# Patient Record
Sex: Female | Born: 1960 | Race: Black or African American | Hispanic: No | Marital: Single | Smoking: Former smoker
Health system: Southern US, Community
[De-identification: ages and names within clinical notes are randomized; demographics above are authoritative.]

## PROBLEM LIST (undated history)

## (undated) DIAGNOSIS — I10 Essential (primary) hypertension: Secondary | ICD-10-CM

## (undated) DIAGNOSIS — D649 Anemia, unspecified: Secondary | ICD-10-CM

## (undated) DIAGNOSIS — I509 Heart failure, unspecified: Secondary | ICD-10-CM

## (undated) HISTORY — DX: Heart failure, unspecified: I50.9

## (undated) HISTORY — PX: SKIN GRAFT: SHX250

---

## 2014-07-19 ENCOUNTER — Encounter: Payer: Self-pay | Admitting: Emergency Medicine

## 2014-07-19 ENCOUNTER — Emergency Department: Payer: Medicaid Other

## 2014-07-19 ENCOUNTER — Inpatient Hospital Stay
Admission: EM | Admit: 2014-07-19 | Discharge: 2014-07-21 | DRG: 871 | Disposition: A | Discharge status: Home / Self Care | Payer: Medicaid Other | Attending: Internal Medicine | Admitting: Internal Medicine

## 2014-07-19 DIAGNOSIS — B9620 Unspecified Escherichia coli [E. coli] as the cause of diseases classified elsewhere: Secondary | ICD-10-CM | POA: Diagnosis present

## 2014-07-19 DIAGNOSIS — Z7982 Long term (current) use of aspirin: Secondary | ICD-10-CM | POA: Diagnosis not present

## 2014-07-19 DIAGNOSIS — R6520 Severe sepsis without septic shock: Secondary | ICD-10-CM | POA: Diagnosis present

## 2014-07-19 DIAGNOSIS — R197 Diarrhea, unspecified: Secondary | ICD-10-CM | POA: Diagnosis not present

## 2014-07-19 DIAGNOSIS — E876 Hypokalemia: Secondary | ICD-10-CM | POA: Diagnosis present

## 2014-07-19 DIAGNOSIS — N390 Urinary tract infection, site not specified: Secondary | ICD-10-CM | POA: Diagnosis present

## 2014-07-19 DIAGNOSIS — Z87891 Personal history of nicotine dependence: Secondary | ICD-10-CM | POA: Diagnosis not present

## 2014-07-19 DIAGNOSIS — D649 Anemia, unspecified: Secondary | ICD-10-CM | POA: Diagnosis present

## 2014-07-19 DIAGNOSIS — Z8249 Family history of ischemic heart disease and other diseases of the circulatory system: Secondary | ICD-10-CM

## 2014-07-19 DIAGNOSIS — Z833 Family history of diabetes mellitus: Secondary | ICD-10-CM

## 2014-07-19 DIAGNOSIS — A419 Sepsis, unspecified organism: Secondary | ICD-10-CM | POA: Diagnosis present

## 2014-07-19 DIAGNOSIS — E860 Dehydration: Secondary | ICD-10-CM | POA: Diagnosis present

## 2014-07-19 DIAGNOSIS — N179 Acute kidney failure, unspecified: Secondary | ICD-10-CM | POA: Diagnosis present

## 2014-07-19 DIAGNOSIS — N170 Acute kidney failure with tubular necrosis: Secondary | ICD-10-CM | POA: Diagnosis present

## 2014-07-19 DIAGNOSIS — R0789 Other chest pain: Secondary | ICD-10-CM | POA: Diagnosis present

## 2014-07-19 DIAGNOSIS — N3000 Acute cystitis without hematuria: Secondary | ICD-10-CM | POA: Diagnosis present

## 2014-07-19 HISTORY — DX: Anemia, unspecified: D64.9

## 2014-07-19 LAB — BASIC METABOLIC PANEL
Anion gap: 13 (ref 5–15)
BUN: 30 mg/dL — ABNORMAL HIGH (ref 6–20)
CHLORIDE: 95 mmol/L — AB (ref 101–111)
CO2: 26 mmol/L (ref 22–32)
CREATININE: 1.32 mg/dL — AB (ref 0.44–1.00)
Calcium: 9.3 mg/dL (ref 8.9–10.3)
GFR calc Af Amer: 53 mL/min — ABNORMAL LOW (ref >60)
GFR calc non Af Amer: 46 mL/min — ABNORMAL LOW (ref >60)
Glucose, Bld: 131 mg/dL — ABNORMAL HIGH (ref 65–99)
POTASSIUM: 2.9 mmol/L — AB (ref 3.5–5.1)
Sodium: 134 mmol/L — ABNORMAL LOW (ref 135–145)

## 2014-07-19 LAB — URINALYSIS COMPLETE WITH MICROSCOPIC (ARMC ONLY)
Bilirubin Urine: NEGATIVE
GLUCOSE, UA: NEGATIVE mg/dL
Ketones, ur: NEGATIVE mg/dL
NITRITE: POSITIVE — AB
Protein, ur: 100 mg/dL — AB
Specific Gravity, Urine: 1.01 (ref 1.005–1.030)
pH: 5 (ref 5.0–8.0)

## 2014-07-19 LAB — CBC
HCT: 32.5 % — ABNORMAL LOW (ref 35.0–47.0)
HEMOGLOBIN: 10.2 g/dL — AB (ref 12.0–16.0)
MCH: 22.8 pg — AB (ref 26.0–34.0)
MCHC: 31.3 g/dL — ABNORMAL LOW (ref 32.0–36.0)
MCV: 72.7 fL — ABNORMAL LOW (ref 80.0–100.0)
PLATELETS: 163 10*3/uL (ref 150–440)
RBC: 4.47 MIL/uL (ref 3.80–5.20)
RDW: 17.8 % — ABNORMAL HIGH (ref 11.5–14.5)
WBC: 14 10*3/uL — AB (ref 3.6–11.0)

## 2014-07-19 LAB — TSH: TSH: 1.659 u[IU]/mL (ref 0.350–4.500)

## 2014-07-19 LAB — TROPONIN I: Troponin I: 0.05 ng/mL — ABNORMAL HIGH (ref <0.031)

## 2014-07-19 MED ORDER — CEFTRIAXONE SODIUM 1 G IJ SOLR
1.0000 g | Freq: Once | INTRAMUSCULAR | Status: AC
  Administered 2014-07-19: 1 g via INTRAVENOUS
  Filled 2014-07-19: qty 10

## 2014-07-19 MED ORDER — CEPHALEXIN 500 MG PO CAPS: 500.0000 mg | ORAL_CAPSULE | Freq: Three times a day (TID) | ORAL | Status: DC

## 2014-07-19 MED ORDER — CEFTRIAXONE SODIUM 1 G IJ SOLR
1.0000 g | INTRAMUSCULAR | Status: DC
Start: 2014-07-20 — End: 2014-07-19

## 2014-07-19 MED ORDER — IBUPROFEN 600 MG PO TABS
ORAL_TABLET | ORAL | Status: AC
  Administered 2014-07-19: 600 mg via ORAL
  Filled 2014-07-19: qty 1

## 2014-07-19 MED ORDER — SODIUM CHLORIDE 0.9 % IV SOLN
INTRAVENOUS | Status: DC
  Administered 2014-07-20 (×2): via INTRAVENOUS

## 2014-07-19 MED ORDER — POTASSIUM CHLORIDE IN NACL 20-0.45 MEQ/L-% IV SOLN
INTRAVENOUS | Status: DC
  Administered 2014-07-19: 19:00:00 via INTRAVENOUS
  Filled 2014-07-19 (×3): qty 1000

## 2014-07-19 MED ORDER — IBUPROFEN 600 MG PO TABS
600.0000 mg | ORAL_TABLET | Freq: Once | ORAL | Status: AC
  Administered 2014-07-19: 600 mg via ORAL

## 2014-07-19 MED ORDER — HYDROCODONE-ACETAMINOPHEN 5-325 MG PO TABS
1.0000 | ORAL_TABLET | ORAL | Status: DC | PRN
  Administered 2014-07-20 – 2014-07-21 (×4): 1 via ORAL
  Filled 2014-07-19 (×4): qty 1

## 2014-07-19 MED ORDER — POTASSIUM CHLORIDE CRYS ER 20 MEQ PO TBCR
40.0000 meq | EXTENDED_RELEASE_TABLET | Freq: Once | ORAL | Status: AC
  Administered 2014-07-20: 40 meq via ORAL
  Filled 2014-07-19: qty 2

## 2014-07-19 MED ORDER — ACETAMINOPHEN 325 MG PO TABS
650.0000 mg | ORAL_TABLET | Freq: Once | ORAL | Status: AC
  Administered 2014-07-19: 650 mg via ORAL

## 2014-07-19 MED ORDER — ACETAMINOPHEN 325 MG PO TABS
650.0000 mg | ORAL_TABLET | Freq: Four times a day (QID) | ORAL | Status: DC | PRN
  Administered 2014-07-20: 650 mg via ORAL
  Filled 2014-07-19: qty 2

## 2014-07-19 MED ORDER — DEXTROSE 5 % IV SOLN
INTRAVENOUS | Status: AC
  Filled 2014-07-19: qty 10

## 2014-07-19 MED ORDER — SODIUM CHLORIDE 0.9 % IV SOLN
Freq: Once | INTRAVENOUS | Status: AC
  Administered 2014-07-19: 1000 mL via INTRAVENOUS

## 2014-07-19 MED ORDER — SODIUM CHLORIDE 0.9 % IV BOLUS (SEPSIS)
1000.0000 mL | Freq: Once | INTRAVENOUS | Status: AC
  Administered 2014-07-19: 1000 mL via INTRAVENOUS

## 2014-07-19 MED ORDER — SODIUM CHLORIDE 0.9 % IJ SOLN
3.0000 mL | Freq: Two times a day (BID) | INTRAMUSCULAR | Status: DC
  Administered 2014-07-20: 3 mL via INTRAVENOUS

## 2014-07-19 MED ORDER — ONDANSETRON HCL 4 MG/2ML IJ SOLN: 4.0000 mg | Freq: Four times a day (QID) | INTRAMUSCULAR | Status: DC | PRN

## 2014-07-19 MED ORDER — DEXTROSE 5 % IV SOLN
1.0000 g | INTRAVENOUS | Status: DC
  Administered 2014-07-20: 1 g via INTRAVENOUS
  Filled 2014-07-19 (×2): qty 10

## 2014-07-19 MED ORDER — SENNOSIDES-DOCUSATE SODIUM 8.6-50 MG PO TABS: 1.0000 | ORAL_TABLET | Freq: Every evening | ORAL | Status: DC | PRN

## 2014-07-19 MED ORDER — ENOXAPARIN SODIUM 40 MG/0.4ML ~~LOC~~ SOLN
40.0000 mg | SUBCUTANEOUS | Status: DC
  Administered 2014-07-20 (×2): 40 mg via SUBCUTANEOUS
  Filled 2014-07-19: qty 0.4

## 2014-07-19 MED ORDER — ACETAMINOPHEN 325 MG PO TABS
ORAL_TABLET | ORAL | Status: AC
  Administered 2014-07-19: 650 mg via ORAL
  Filled 2014-07-19: qty 2

## 2014-07-19 MED ORDER — ONDANSETRON HCL 4 MG PO TABS
4.0000 mg | ORAL_TABLET | Freq: Four times a day (QID) | ORAL | Status: DC | PRN
  Administered 2014-07-20: 4 mg via ORAL
  Filled 2014-07-19: qty 1

## 2014-07-19 MED ORDER — ACETAMINOPHEN 650 MG RE SUPP: 650.0000 mg | Freq: Four times a day (QID) | RECTAL | Status: DC | PRN

## 2014-07-19 NOTE — ED Notes (Signed)
Patient requesting something for pain, MD made aware, new orders received, see MAR.
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
## 2014-07-19 NOTE — ED Notes (Signed)
Pharmacy called for IV fluids to be sent to ER.

## 2014-07-19 NOTE — ED Notes (Signed)
Pharmacy called for IV fluids

## 2014-07-19 NOTE — ED Notes (Signed)
MD at bedside. 

## 2014-07-19 NOTE — ED Notes (Signed)
Pt c/o body aches,fever, chills x 2 days. Nonproductive sputum. Denies CP. RR even and unlabored. EDP at bedside. Color WNL

## 2014-07-19 NOTE — ED Notes (Signed)
Spoke with hospitalist, Dr. Sheryle Hailiamond, reports Rocephin will be rescheduled for tomorrow.

## 2014-07-19 NOTE — ED Notes (Signed)
X-ray at bedside

## 2014-07-19 NOTE — ED Notes (Signed)
Spoke with Dr. Sheryle Hailiamond, new order received, 1L NS to be administered at this time.

## 2014-07-19 NOTE — ED Notes (Signed)
C/o weakness, SOB and aching all over x 2 days, denies chest pain

## 2014-07-19 NOTE — ED Provider Notes (Signed)
[facility] Emergency Department Provider Note  ____________________________________________  Time seen: 2:30 PM  I have reviewed the triage vital signs and the nursing notes.   HISTORY  Chief Complaint Shortness of Breath and Weakness    HPI Brenda Hodge is a 54 y.o. female who complains of body aches, fever, chills, and nonproductive cough for the last 2 days. She also reports feeling very weak and tired all over. No weight changes, but she feels like her energy level is low and she is having difficulty sleeping. She feels restless. Denies any history of thyroid dysfunction, and no family history. Denies shortness of breath or chest pain. No abdominal pain, nausea vomiting or diarrhea. No lightheadedness.     Past Medical History  Diagnosis Date  . Anemia     There are no active problems to display for this patient.   History reviewed. No pertinent past surgical history.  Current Outpatient Rx  Name  Route  Sig  Dispense  Refill  . Aspirin-Caffeine 1000-65 MG PACK   Oral   Take 1 tablet by mouth daily as needed (As needed for headache).           Allergies Review of patient's allergies indicates no known allergies.  History reviewed. No pertinent family history.  Social History History  Substance Use Topics  . Smoking status: Former Games developer  . Smokeless tobacco: Not on file  . Alcohol Use: No    Review of Systems  Constitutional: Fever and chills. No weight changes Eyes:No blurry vision or double vision.  ENT: No sore throat. Cardiovascular: No chest pain. Respiratory: Nonproductive cough, no dyspnea Gastrointestinal: Negative for abdominal pain, vomiting and diarrhea.  No BRBPR or melena. Genitourinary: Negative for dysuria, urinary retention, bloody urine, or difficulty urinating. Musculoskeletal: Negative for back pain. No joint swelling or pain. Skin: Negative for rash. Neurological: Negative for headaches, focal weakness  or numbness. Psychiatric:No anxiety or depression.   Endocrine:No hot/cold intolerance, low energy, difficulty sleeping.  10-point ROS otherwise negative.  ____________________________________________   PHYSICAL EXAM:  VITAL SIGNS: ED Triage Vitals  Enc Vitals Group     BP 07/19/14 1344 132/62 mmHg     Pulse Rate 07/19/14 1342 130     Resp 07/19/14 1342 20     Temp 07/19/14 1342 98.2 F (36.8 C)     Temp Source 07/19/14 1342 Oral     SpO2 07/19/14 1342 98 %     Weight 07/19/14 1342 260 lb (117.935 kg)     Height 07/19/14 1342  (1.753 m)     Head Cir --      Peak Flow --      Pain Score 07/19/14 1344 10     Pain Loc --      Pain Edu? --      Excl. in GC? --      Constitutional: Alert and oriented. Well appearing and in no distress. Eyes: No scleral icterus. No conjunctival pallor. PERRL. EOMI. No proptosis ENT   Head: Normocephalic and atraumatic.   Nose: No congestion/rhinnorhea. No septal hematoma   Mouth/Throat: MMM, no pharyngeal erythema   Neck: No stridor. No SubQ emphysema. Mild fullness of the thyroid Hematological/Lymphatic/Immunilogical: No cervical lymphadenopathy. Cardiovascular: Regular rhythm, tachycardia to 120.Marland Kitchen Normal and symmetric distal pulses are present in all extremities. No murmurs, rubs, or gallops. Respiratory: Normal respiratory effort without tachypnea nor retractions. Breath sounds are clear and equal bilaterally. No wheezes/rales/rhonchi. Gastrointestinal: Soft and nontender. No distention. There is no  CVA tenderness.  No rebound, rigidity, or guarding. Genitourinary: deferred Musculoskeletal: Nontender with normal range of motion in all extremities. No joint effusions.  No lower extremity tenderness.  No edema. Neurologic:   Normal speech and language.  CN 2-10 normal. Motor grossly intact. No pronator drift.  Normal gait. No gross focal neurologic deficits are appreciated.  Skin:  Skin is warm, dry and intact. No rash  noted.  No petechiae, purpura, or bullae. Psychiatric: Mood and affect are normal. Speech and behavior are normal. Patient exhibits appropriate insight and judgment.  ____________________________________________    LABS (pertinent positives/negatives)  Results for orders placed or performed during the hospital encounter of 07/19/14  CBC  Result Value Ref Range   WBC 14.0 (H) 3.6 - 11.0 K/uL   RBC 4.47 3.80 - 5.20 MIL/uL   Hemoglobin 10.2 (L) 12.0 - 16.0 g/dL   HCT 16.132.5 (L) 09.635.0 - 04.547.0 %   MCV 72.7 (L) 80.0 - 100.0 fL   MCH 22.8 (L) 26.0 - 34.0 pg   MCHC 31.3 (L) 32.0 - 36.0 g/dL   RDW 40.917.8 (H) 81.111.5 - 91.414.5 %   Platelets 163 150 - 440 K/uL  Urinalysis complete, with microscopic [facility])  Result Value Ref Range   Color, Urine YELLOW (A) YELLOW   APPearance CLOUDY (A) CLEAR   Glucose, UA NEGATIVE NEGATIVE mg/dL   Bilirubin Urine NEGATIVE NEGATIVE   Ketones, ur NEGATIVE NEGATIVE mg/dL   Specific Gravity, Urine 1.010 1.005 - 1.030   Hgb urine dipstick 3+ (A) NEGATIVE   pH 5.0 5.0 - 8.0   Protein, ur 100 (A) NEGATIVE mg/dL   Nitrite POSITIVE (A) NEGATIVE   Leukocytes, UA 3+ (A) NEGATIVE   RBC / HPF TOO NUMEROUS TO COUNT 0 - 5 RBC/hpf   WBC, UA TOO NUMEROUS TO COUNT 0 - 5 WBC/hpf   Bacteria, UA MANY (A) NONE SEEN   Squamous Epithelial / LPF 0-5 (A) NONE SEEN   WBC Clumps PRESENT    Hyaline Casts, UA PRESENT      ____________________________________________   EKG  Sinus tachycardia with a rate of 121. Normal axis and intervals. Normal QRS, ST segments, and T waves.  ____________________________________________    RADIOLOGY  Chest x-ray unremarkable  ____________________________________________   PROCEDURES  ____________________________________________   INITIAL IMPRESSION / ASSESSMENT AND PLAN / ED COURSE  Pertinent labs & imaging results that were available during my care of the patient were reviewed by me and considered in my medical decision making (see chart  for details).  The patient's presentation is concerning for infection, hyperthyroidism, or metabolic dysfunction. We'll check labs, urine, chest x-ray, and give her IV fluids for the tachycardia.  ----------------------------------------- 3:16 PM on 07/19/2014 -----------------------------------------  The patient has an elevated white blood cell count and her urinalysis reveals a clear urinary tract infection. CONTINUE giving her IV fluids and give her IV ceftriaxone. The patient is nontoxic, well-appearing despite the fatigue and tachycardia. She does not appear to be septic or in shock. I'll follow-up the remainder of her labs, and anticipate discharging her home on oral antibiotics.  ----------------------------------------- 3:29 PM on 07/19/2014 -----------------------------------------  The patient is currently pending a chemistry and TSH.  The patient signed out to Dr. Derrill KayGoodman at 3:30 PM  ____________________________________________   FINAL CLINICAL IMPRESSION(S) / ED DIAGNOSES  Final diagnoses:  None      Sharman CheekPhillip Syna Gad, MD 07/19/14 1529

## 2014-07-19 NOTE — ED Notes (Signed)
EDP aware of critical lab values.

## 2014-07-19 NOTE — ED Notes (Addendum)
Patient tachycardic on monitor in 120-125s. Patient reports heart beating fast. Patient laying in bed, resting. Reports headache has increased to a 4/10. MD made aware.

## 2014-07-20 ENCOUNTER — Inpatient Hospital Stay: Payer: Medicaid Other

## 2014-07-20 LAB — CBC
HEMATOCRIT: 31.1 % — AB (ref 35.0–47.0)
HEMOGLOBIN: 9.6 g/dL — AB (ref 12.0–16.0)
MCH: 22.6 pg — AB (ref 26.0–34.0)
MCHC: 31 g/dL — AB (ref 32.0–36.0)
MCV: 73 fL — ABNORMAL LOW (ref 80.0–100.0)
Platelets: 137 10*3/uL — ABNORMAL LOW (ref 150–440)
RBC: 4.26 MIL/uL (ref 3.80–5.20)
RDW: 17.8 % — ABNORMAL HIGH (ref 11.5–14.5)
WBC: 15.2 10*3/uL — ABNORMAL HIGH (ref 3.6–11.0)

## 2014-07-20 LAB — BASIC METABOLIC PANEL
ANION GAP: 12 (ref 5–15)
Anion gap: 8 (ref 5–15)
BUN: 30 mg/dL — ABNORMAL HIGH (ref 6–20)
BUN: 25 mg/dL — AB (ref 6–20)
CALCIUM: 8.5 mg/dL — AB (ref 8.9–10.3)
CALCIUM: 8.1 mg/dL — AB (ref 8.9–10.3)
CO2: 26 mmol/L (ref 22–32)
CO2: 25 mmol/L (ref 22–32)
Chloride: 99 mmol/L — ABNORMAL LOW (ref 101–111)
Chloride: 101 mmol/L (ref 101–111)
Creatinine, Ser: 1.43 mg/dL — ABNORMAL HIGH (ref 0.44–1.00)
Creatinine, Ser: 1.18 mg/dL — ABNORMAL HIGH (ref 0.44–1.00)
GFR calc Af Amer: >60 mL/min (ref >60)
GFR calc non Af Amer: 42 mL/min — ABNORMAL LOW (ref >60)
GFR, EST AFRICAN AMERICAN: 48 mL/min — AB (ref >60)
GFR, EST NON AFRICAN AMERICAN: 52 mL/min — AB (ref >60)
GLUCOSE: 124 mg/dL — AB (ref 65–99)
Glucose, Bld: 115 mg/dL — ABNORMAL HIGH (ref 65–99)
Potassium: 3.8 mmol/L (ref 3.5–5.1)
Potassium: 4 mmol/L (ref 3.5–5.1)
Sodium: 137 mmol/L (ref 135–145)
Sodium: 134 mmol/L — ABNORMAL LOW (ref 135–145)

## 2014-07-20 LAB — TROPONIN I
TROPONIN I: 0.08 ng/mL — AB (ref <0.031)
Troponin I: 0.03 ng/mL (ref <0.031)
Troponin I: <0.03 ng/mL (ref <0.031)

## 2014-07-20 LAB — PHOSPHORUS: PHOSPHORUS: 1.4 mg/dL — AB (ref 2.5–4.6)

## 2014-07-20 LAB — MAGNESIUM: Magnesium: 2 mg/dL (ref 1.7–2.4)

## 2014-07-20 MED ORDER — POTASSIUM CHLORIDE CRYS ER 20 MEQ PO TBCR
40.0000 meq | EXTENDED_RELEASE_TABLET | Freq: Two times a day (BID) | ORAL | Status: DC
  Administered 2014-07-20 – 2014-07-21 (×3): 40 meq via ORAL
  Filled 2014-07-20 (×3): qty 2

## 2014-07-20 MED ORDER — POTASSIUM CHLORIDE IN NACL 20-0.9 MEQ/L-% IV SOLN
INTRAVENOUS | Status: DC
  Administered 2014-07-20 – 2014-07-21 (×2): via INTRAVENOUS
  Filled 2014-07-20 (×5): qty 1000

## 2014-07-20 MED ORDER — CEFTRIAXONE SODIUM IN DEXTROSE 20 MG/ML IV SOLN
1.0000 g | INTRAVENOUS | Status: DC
  Filled 2014-07-20: qty 50

## 2014-07-20 MED ORDER — ENOXAPARIN SODIUM 40 MG/0.4ML ~~LOC~~ SOLN
SUBCUTANEOUS | Status: AC
  Administered 2014-07-20: 40 mg via SUBCUTANEOUS
  Filled 2014-07-20: qty 0.4

## 2014-07-20 NOTE — Progress Notes (Signed)
Alert and oriented. Vitals stable. Sinus tach on tele. 2L acute of oxygen. One episode of emesis today, oral zofran given with relief. Two episodes of diarrhea today, will pass on to night shift to check stool if continues to occur. Patient complained of headache today, given norco x2. IV abx continued. Will continue to monitor.

## 2014-07-20 NOTE — Progress Notes (Signed)
Patient ID: Brenda Hodge, female   DOB: 21-Feb-1961, 54 y.o.   MRN: [id number] [facility] PRO[facility]  DOA: 07/19/2014 PCP: No primary care provider on file.  HPI/Subjective: Patient feeling weak. Just had some diarrhea and vomiting. Patient has a lot of gas in her lower abdomen. Family asking questions about beans and soda. I advised not to eat or drink these at this point. Patient with urinary frequency. Patient asking how much longer she has to stay in the hospital.  Objective: Filed Vitals:   07/20/14 0356  BP: 105/53  Pulse: 82  Temp: 97.4 F (36.3 C)  Resp: 18    Intake/Output Summary (Last 24 hours) at 07/20/14 0957 Last data filed at 07/20/14 [id number]  Gross per 24 hour  Intake 683.33 ml  Output    450 ml  Net 233.33 ml   Filed Weights   07/19/14 1342 07/20/14 0000  Weight: 117.935 kg (260 lb) 92.08 kg (203 lb)    ROS: Review of Systems  Constitutional: Negative for fever and chills.  Eyes: Negative for blurred vision.  Respiratory: Negative for cough and shortness of breath.   Cardiovascular: Negative for chest pain.  Gastrointestinal: Positive for nausea, vomiting and diarrhea. Negative for constipation.  Genitourinary: Positive for frequency. Negative for dysuria.  Musculoskeletal: Negative for joint pain.  Neurological: Negative for dizziness and headaches.   Exam: Physical Exam  HENT:  Nose: No mucosal edema.  Mouth/Throat: No oropharyngeal exudate or posterior oropharyngeal edema.  Eyes: Conjunctivae, EOM and lids are normal. Pupils are equal, round, and reactive to light.  Neck: No JVD present. Carotid bruit is not present. No edema present. No thyroid mass and no thyromegaly present.  Cardiovascular: S1 normal and S2 normal.  Exam reveals no gallop.   No murmur heard. Pulses:      Dorsalis pedis pulses are 2+ on the right side, and 2+ on the left side.  Respiratory: No respiratory distress. She has no wheezes. She has no rhonchi. She has  no rales.  GI: Soft. Bowel sounds are normal. There is no tenderness.  Lymphadenopathy:    She has no cervical adenopathy.  Neurological: She is alert. No cranial nerve deficit.  Skin: Skin is warm. No rash noted. Nails show no clubbing.  Psychiatric: She has a normal mood and affect.     Data Reviewed: Basic Metabolic Panel:  Recent Labs Lab 07/19/14 1400  NA 134*  K 2.9*  CL 95*  CO2 26  GLUCOSE 131*  BUN 30*  CREATININE 1.32*  CALCIUM 9.3   CBC:  Recent Labs Lab 07/19/14 1400 07/20/14 0815  WBC 14.0* 15.2*  HGB 10.2* 9.6*  HCT 32.5* 31.1*  MCV 72.7* 73.0*  PLT 163 137*   Cardiac Enzymes:  Recent Labs Lab 07/19/14 1400 07/20/14  TROPONINI 0.05* 0.03    Studies: Dg Chest Port 1 View  07/19/2014   CLINICAL DATA:  Shortness of breath and chest pain  EXAM: PORTABLE CHEST - 1 VIEW  COMPARISON:  None.  FINDINGS: The heart size and mediastinal contours are within normal limits. Both lungs are clear. The visualized skeletal structures are unremarkable.  IMPRESSION: No active disease.   Electronically Signed   By: Alcide CleverMark  Lukens M.D.   On: 07/19/2014 14:36    Scheduled Meds: . cefTRIAXone (ROCEPHIN)  IV  1 g Intravenous Q24H  . enoxaparin (LOVENOX) injection  40 mg Subcutaneous Q24H  . potassium chloride  40 mEq Oral BID  . sodium chloride  3 mL  Intravenous Q12H   Continuous Infusions: . 0.9 % NaCl with KCl 20 mEq / L      Assessment/Plan: Principal Problem:   Sepsis Active Problems:   Chest discomfort   UTI (urinary tract infection)   Dehydration   Hypokalemia   AKI (acute kidney injury)   1. Clinical sepsis. Cystitis without hematuria. -Continue IV Rocephin and IV fluid hydration. Vital signs have improved.  2. Severe hypokalemia -Potassium not rechecked today will recheck now. We'll put potassium in IV fluid hydration and oral supplementation if able to tolerate. 3. Some nausea vomiting and diarrhea. -We'll observe this for now. 4. Acute kidney  injury/dehydration -IV fluid hydration. 5. Chest pain, initial troponin borderline. -Chest pain resolved with potassium supplementation. Next troponin normalized. 6 anemia-we'll check a hemoglobin tomorrow. Likely also dilutional.  Code Status:     Code Status Orders        Start     Ordered   07/19/14 2353  Full code   Continuous     07/19/14 2352     Family Communication: Family at bedside Disposition Plan: Home potentially tomorrow  Time spent: 30minutes  Alford HighlandWIETING, Rifky Lapre  [facility]

## 2014-07-20 NOTE — Care Management Note (Signed)
Case Management Note  Patient Details  Name: Brenda Hodge MRN: [id number] Date of Birth: Jul 16, 1960  Subjective/Objective:            Presents from home.  Lives with her parents.  No PCP and on no medications.  Independent in all adls.  Drives.  Admitted under observation for nausea and vomiting.  Sx improved.  She has no insurance .          Action/Plan:          Expected Discharge Date:                  Expected Discharge Plan:    Assess for meds at discharge to determine if patient needs any assistance.  She is a resident of [place].  In-House Referral:     Discharge planning Services     Post Acute Care Choice:    Choice offered to:     DME Arranged:    DME Agency:     HH Arranged:    HH Agency:     Status of Service:     Medicare Important Message Given:    Date Medicare IM Given:    Medicare IM give by:    Date Additional Medicare IM Given:    Additional Medicare Important Message give by:     If discussed at Long Length of Stay Meetings, dates discussed:    Additional Comments:  Brenda Hodge, Brenda Faux R, RN 07/20/2014, 1:39 PM

## 2014-07-20 NOTE — Progress Notes (Signed)
A&O. No complaints. Admitted from home with shortness of breath and UTI. Received IV antibiotics. IVF infusing. On 2L O2. No s/s distress.

## 2014-07-21 LAB — BASIC METABOLIC PANEL
ANION GAP: 7 (ref 5–15)
BUN: 19 mg/dL (ref 6–20)
CO2: 28 mmol/L (ref 22–32)
Calcium: 8.8 mg/dL — ABNORMAL LOW (ref 8.9–10.3)
Chloride: 103 mmol/L (ref 101–111)
Creatinine, Ser: 1.15 mg/dL — ABNORMAL HIGH (ref 0.44–1.00)
GFR calc Af Amer: >60 mL/min (ref >60)
GFR calc non Af Amer: 54 mL/min — ABNORMAL LOW (ref >60)
Glucose, Bld: 120 mg/dL — ABNORMAL HIGH (ref 65–99)
Potassium: 4.5 mmol/L (ref 3.5–5.1)
SODIUM: 138 mmol/L (ref 135–145)

## 2014-07-21 LAB — URINE CULTURE: Culture: >=100000

## 2014-07-21 LAB — GLUCOSE, CAPILLARY
GLUCOSE-CAPILLARY: 120 mg/dL — AB (ref 70–99)
Glucose-Capillary: 120 mg/dL — ABNORMAL HIGH (ref 70–99)

## 2014-07-21 MED ORDER — SIMETHICONE 80 MG PO CHEW
160.0000 mg | CHEWABLE_TABLET | Freq: Once | ORAL | Status: AC
  Administered 2014-07-21: 160 mg via ORAL
  Filled 2014-07-21: qty 2

## 2014-07-21 MED ORDER — LEVOFLOXACIN 250 MG PO TABS: 250.0000 mg | ORAL_TABLET | Freq: Every day | ORAL | Status: DC

## 2014-07-21 MED ORDER — LEVOFLOXACIN 250 MG PO TABS
250.0000 mg | ORAL_TABLET | Freq: Every day | ORAL | Status: DC
  Administered 2014-07-21: 250 mg via ORAL
  Filled 2014-07-21: qty 1

## 2014-07-21 NOTE — Care Management (Signed)
Patient is for discharge today per attending.  She anticipates patient will discharge on oral Levaquin which will be cost prohibitive for the patient.  She has no income and no insurance.  Asked for script asap so can obtain medication assistance form [facility].

## 2014-07-21 NOTE — Discharge Instructions (Addendum)
Urinary Tract Infection Urinary tract infections (UTIs) can develop anywhere along your urinary tract. Your urinary tract is your body's drainage system for removing wastes and extra water. Your urinary tract includes two kidneys, two ureters, a bladder, and a urethra. Your kidneys are a pair of bean-shaped organs. Each kidney is about the size of your fist. They are located below your ribs, one on each side of your spine. CAUSES Infections are caused by microbes, which are microscopic organisms, including fungi, viruses, and bacteria. These organisms are so small that they can only be seen through a microscope. Bacteria are the microbes that most commonly cause UTIs. SYMPTOMS  Symptoms of UTIs may vary by age and gender of the patient and by the location of the infection. Symptoms in young women typically include a frequent and intense urge to urinate and a painful, burning feeling in the bladder or urethra during urination. Older women and men are more likely to be tired, shaky, and weak and have muscle aches and abdominal pain. A fever may mean the infection is in your kidneys. Other symptoms of a kidney infection include pain in your back or sides below the ribs, nausea, and vomiting. DIAGNOSIS To diagnose a UTI, your caregiver will ask you about your symptoms. Your caregiver also will ask to provide a urine sample. The urine sample will be tested for bacteria and white blood cells. White blood cells are made by your body to help fight infection. TREATMENT  Typically, UTIs can be treated with medication. Because most UTIs are caused by a bacterial infection, they usually can be treated with the use of antibiotics. The choice of antibiotic and length of treatment depend on your symptoms and the type of bacteria causing your infection. HOME CARE INSTRUCTIONS  If you were prescribed antibiotics, take them exactly as your caregiver instructs you. Finish the medication even if you feel better after you  have only taken some of the medication.  Drink enough water and fluids to keep your urine clear or pale yellow.  Avoid caffeine, tea, and carbonated beverages. They tend to irritate your bladder.  Empty your bladder often. Avoid holding urine for long periods of time.  Empty your bladder before and after sexual intercourse.  After a bowel movement, women should cleanse from front to back. Use each tissue only once. SEEK MEDICAL CARE IF:   You have back pain.  You develop a fever.  Your symptoms do not begin to resolve within 3 days. SEEK IMMEDIATE MEDICAL CARE IF:   You have severe back pain or lower abdominal pain.  You develop chills.  You have nausea or vomiting.  You have continued burning or discomfort with urination. MAKE SURE YOU:   Understand these instructions.  Will watch your condition.  Will get help right away if you are not doing well or get worse. Document Released: 12/12/2004 Document Revised: 09/03/2011 Document Reviewed: 04/12/2011 [facility] Patient Information 2015 [place], [place]. This information is not intended to replace advice given to you by your health care provider. Make sure you discuss any questions you have with your health care provider.   GO ACROSS THE STREET TO THE MEDICATION MANAGEMENT CLINIC  ([phone]) TO GET LEVAQUIN FILLED

## 2014-07-21 NOTE — Discharge Summary (Signed)
[facility] [place] [facility]   PATIENT NAME: Brenda GibsonDella Hodge    MR#:  [id number]  DATE OF BIRTH:  11-Mar-1961  DATE OF ADMISSION:  07/19/2014 ADMITTING PHYSICIAN: Oralia Manisavid Willis, MD  DATE OF DISCHARGE: No discharge date for patient encounter.  PRIMARY CARE PHYSICIAN: No primary care provider on file.    ADMISSION DIAGNOSIS:  Acute cystitis without hematuria [N30.00]  DISCHARGE DIAGNOSIS:  Principal Problem:   Sepsis Active Problems:   Chest discomfort   UTI (urinary tract infection)   Dehydration   Hypokalemia   AKI (acute kidney injury)   SECONDARY DIAGNOSIS:   Past Medical History  Diagnosis Date  . Anemia     HOSPITAL COURSE:   Brenda Hodge is a [age] year old African-American female with no significant past medical history presented to the hospital secondary to weakness and sepsis.  #1 clinical sepsis secondary to acute cystitis. Urine cultures are positive for gram-negative rods final sensitivities are pending. Patient was on Rocephin in the hospital with improvement in her white count and fevers. She is being discharged on Levaquin.   #2 Hypokalemia replaced potassium in the hospital and normalized now.  #3 Acute renal failure secondary to prerenal and also ATN from sepsis. After IV fluids,renal function has improved.   #4 Chest discomfort- nonspeciic, resolved.    DISCHARGE CONDITIONS:   Stable  CONSULTS OBTAINED:    None  DRUG ALLERGIES:  No Known Allergies  DISCHARGE MEDICATIONS:   Current Discharge Medication List    START taking these medications   Details  levofloxacin (LEVAQUIN) 250 MG tablet Take 1 tablet (250 mg total) by mouth daily. Qty: 5 tablet, Refills: 0      CONTINUE these medications which have NOT CHANGED   Details  Aspirin-Caffeine 1000-65 MG PACK Take 1 tablet by mouth daily as needed (As needed for headache).         DISCHARGE INSTRUCTIONS:    If you experience worsening of your admission  symptoms, develop shortness of breath, life threatening emergency, suicidal or homicidal thoughts you must seek medical attention immediately by calling 911 or calling your MD immediately  if symptoms less severe.  You Must read complete instructions/literature along with all the possible adverse reactions/side effects for all the Medicines you take and that have been prescribed to you. Take any new Medicines after you have completely understood and accept all the possible adverse reactions/side effects.   Please note  You were cared for by a hospitalist during your hospital stay. If you have any questions about your discharge medications or the care you received while you were in the hospital after you are discharged, you can call the unit and asked to speak with the hospitalist on call if the hospitalist that took care of you is not available. Once you are discharged, your primary care physician will handle any further medical issues. Please note that NO REFILLS for any discharge medications will be authorized once you are discharged, as it is imperative that you return to your primary care physician (or establish a relationship with a primary care physician if you do not have one) for your aftercare needs so that they can reassess your need for medications and monitor your lab values.    Today   CHIEF COMPLAINT:   Chief Complaint  Patient presents with  . Shortness of Breath  . Weakness    HISTORY OF PRESENT ILLNESS:  Brenda Hodge is a 54 y.o. female with minimal past medical history on  no routine home medications who presented to the ED after 2-1/2 days of malaise and chills with shortness of breath. Noted to have sepsis. Please refer to H&P for more details.   VITAL SIGNS:  Blood pressure 153/77, pulse 96, temperature 98.1 F (36.7 C), temperature source Oral, resp. rate 20, height  (1.753 m), weight 95.346 kg (210 lb 3.2 oz), SpO2 99 %.  I/O:   Intake/Output Summary (Last  24 hours) at 07/21/14 1343 Last data filed at 07/21/14 1237  Gross per 24 hour  Intake 1681.25 ml  Output   3150 ml  Net -1468.75 ml    PHYSICAL EXAMINATION:  GENERAL:  54 y.o.-year-old patient lying in the bed with no acute distress.  EYES: Pupils equal, round, reactive to light and accommodation. No scleral icterus. Extraocular muscles intact.  HEENT: Head atraumatic, normocephalic. Oropharynx and nasopharynx clear.  NECK:  Supple, no jugular venous distention. No thyroid enlargement, no tenderness.  LUNGS: Normal breath sounds bilaterally, no wheezing, rales,rhonchi or crepitation. No use of accessory muscles of respiration.  CARDIOVASCULAR: S1, S2 normal. No murmurs, rubs, or gallops.  ABDOMEN: Soft, non-tender, non-distended. Bowel sounds present. No organomegaly or mass.  EXTREMITIES: No pedal edema, cyanosis, or clubbing.  NEUROLOGIC: Cranial nerves II through XII are intact. Muscle strength 5/5 in all extremities. Sensation intact. Gait not checked.  PSYCHIATRIC: The patient is alert and oriented x 3.  SKIN: No obvious rash, lesion, or ulcer.   DATA REVIEW:   CBC  Recent Labs Lab 07/20/14 0815  WBC 15.2*  HGB 9.6*  HCT 31.1*  PLT 137*    Chemistries   Recent Labs Lab 07/20/14 1253 07/21/14 0353  NA 134* 138  K 4.0 4.5  CL 101 103  CO2 25 28  GLUCOSE 124* 120*  BUN 25* 19  CREATININE 1.18* 1.15*  CALCIUM 8.1* 8.8*  MG 2.0  --     Cardiac Enzymes  Recent Labs Lab 07/20/14 1253  TROPONINI <0.03    Microbiology Results  Results for orders placed or performed during the hospital encounter of 07/19/14  Urine culture     Status: None (Preliminary result)   Collection Time: 07/19/14  1:23 PM  Result Value Ref Range Status   Specimen Description URINE, RANDOM  Final   Special Requests URINE, RANDOM  Final   Culture   Final    >=100,000 COLONIES/mL GRAM NEGATIVE RODS IDENTIFICATION TO FOLLOW SUSCEPTIBILITIES TO FOLLOW    Report Status PENDING   Incomplete    RADIOLOGY:  Dg Chest Port 1 View  07/19/2014   CLINICAL DATA:  Shortness of breath and chest pain  EXAM: PORTABLE CHEST - 1 VIEW  COMPARISON:  None.  FINDINGS: The heart size and mediastinal contours are within normal limits. Both lungs are clear. The visualized skeletal structures are unremarkable.  IMPRESSION: No active disease.   Electronically Signed   By: Alcide Clever M.D.   On: 07/19/2014 14:36    EKG:   Orders placed or performed during the hospital encounter of 07/19/14  . ED EKG within 10 minutes  . ED EKG within 10 minutes  . EKG 12-Lead  . EKG 12-Lead      Management plans discussed with the patient, family and they are in agreement.  CODE STATUS:     Code Status Orders        Start     Ordered   07/19/14 2353  Full code   Continuous     07/19/14 2352  TOTAL TIME TAKING CARE OF THIS PATIENT: 40 minutes.    Keely Drennan M.D on 07/21/2014 at 1:43 PM  Between 7am to 6pm - Pager - [phone]  After 6pm go to [email or web address] - password [email or web address] [facility]  [facility]  Office  [phone]  CC: Primary care physician; No primary care provider on file.

## 2014-07-21 NOTE — Plan of Care (Signed)
Problem: Phase I Progression Outcomes Goal: Other Phase I Outcomes/Goals Outcome: Progressing Tylenol was administered for fever of 100F. Patient has a slight temp improvement. She denied nausea, and has VS WDL for patient. Will continue to monitor.

## 2015-11-06 IMAGING — CR DG CHEST 1V PORT
1 series · 1 of 1 positions shown · non-contrast
Comparison: None.

CLINICAL DATA: Shortness of breath and chest pain

EXAM:
PORTABLE CHEST - 1 VIEW

[ap]
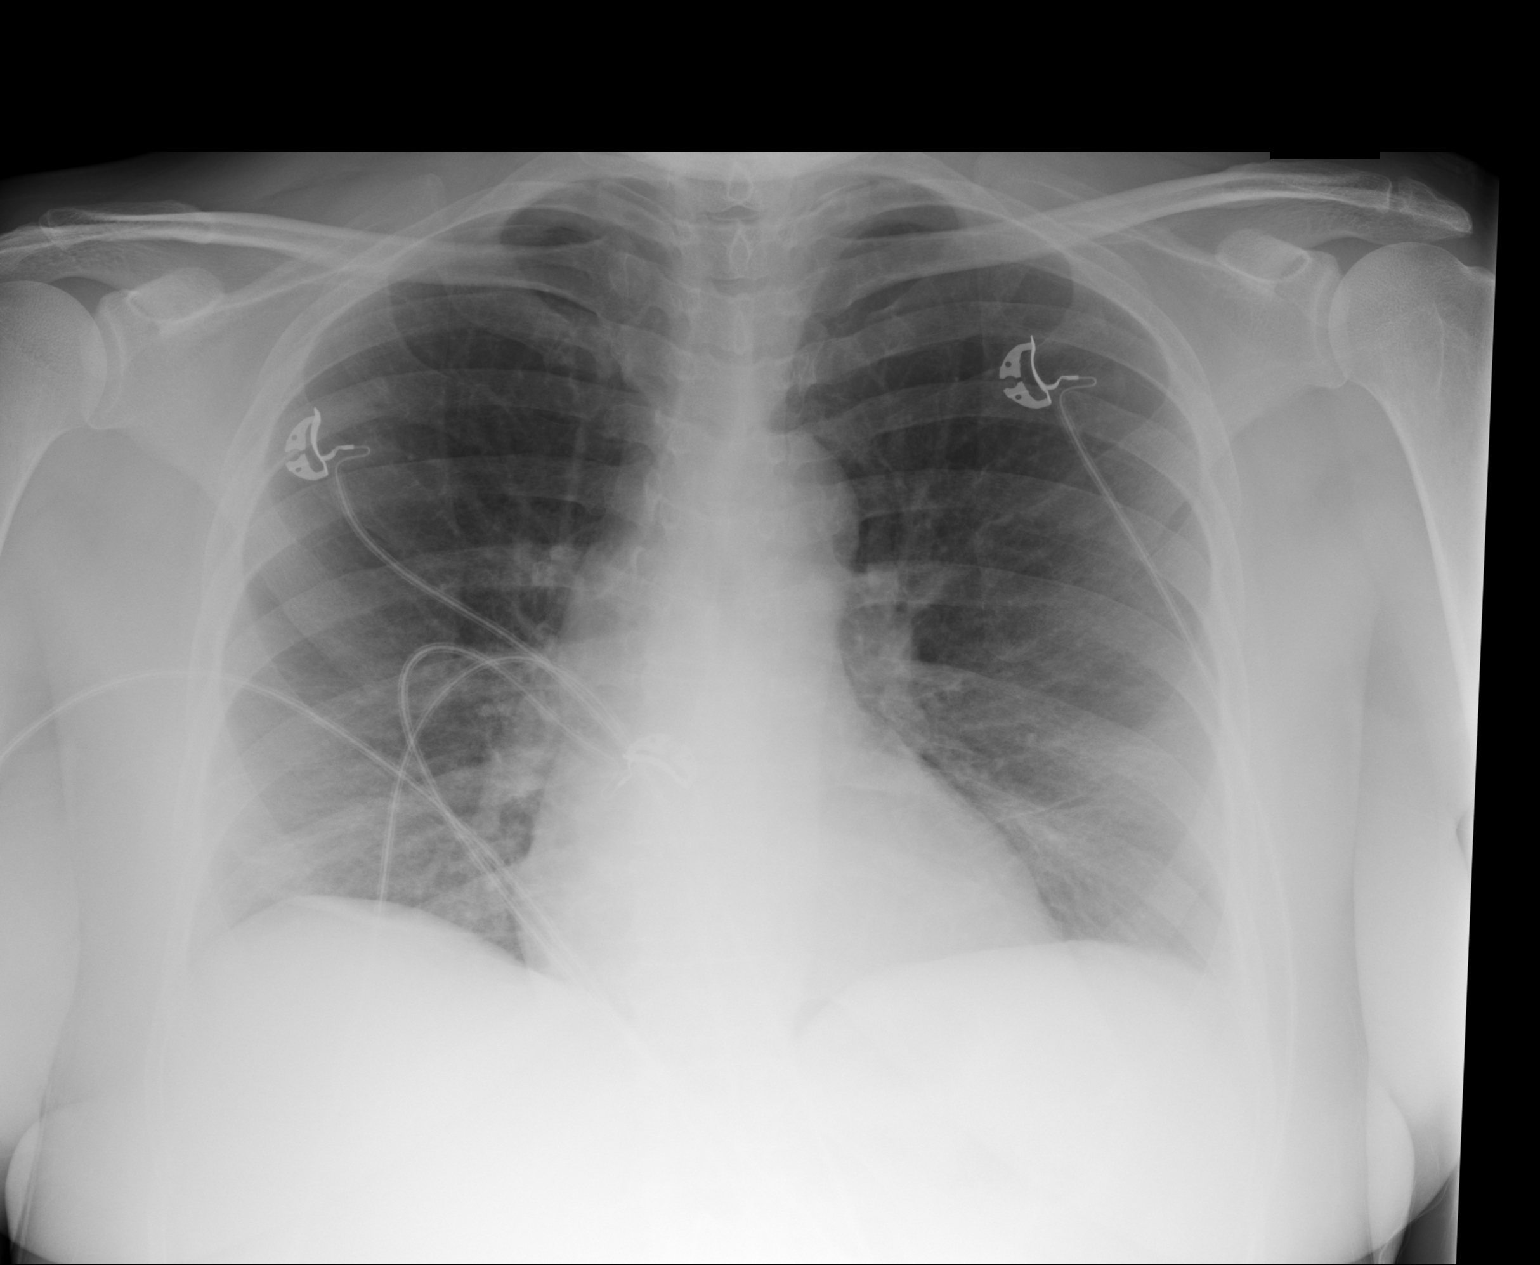

[1 of 1 positions shown; findings below may reference images not displayed]

FINDINGS: The heart size and mediastinal contours are within normal limits.
Both lungs are clear. The visualized skeletal structures are
unremarkable.
IMPRESSION: No active disease.

## 2021-12-05 ENCOUNTER — Other Ambulatory Visit: Payer: Self-pay

## 2021-12-05 ENCOUNTER — Emergency Department: Payer: Medicaid Other

## 2021-12-05 DIAGNOSIS — R531 Weakness: Secondary | ICD-10-CM | POA: Diagnosis not present

## 2021-12-05 DIAGNOSIS — I509 Heart failure, unspecified: Secondary | ICD-10-CM | POA: Insufficient documentation

## 2021-12-05 DIAGNOSIS — R0601 Orthopnea: Secondary | ICD-10-CM | POA: Diagnosis not present

## 2021-12-05 DIAGNOSIS — I110 Hypertensive heart disease with heart failure: Secondary | ICD-10-CM | POA: Diagnosis not present

## 2021-12-05 DIAGNOSIS — R0600 Dyspnea, unspecified: Secondary | ICD-10-CM | POA: Diagnosis not present

## 2021-12-05 DIAGNOSIS — R5383 Other fatigue: Secondary | ICD-10-CM | POA: Diagnosis present

## 2021-12-05 LAB — POC URINE PREG, ED: Preg Test, Ur: NEGATIVE

## 2021-12-05 LAB — URINALYSIS, ROUTINE W REFLEX MICROSCOPIC
Bilirubin Urine: NEGATIVE
Glucose, UA: NEGATIVE mg/dL
Ketones, ur: NEGATIVE mg/dL
Leukocytes,Ua: NEGATIVE
Nitrite: NEGATIVE
Protein, ur: 100 mg/dL — AB
Specific Gravity, Urine: <1.005 — ABNORMAL LOW (ref 1.005–1.030)
pH: 5.5 (ref 5.0–8.0)

## 2021-12-05 LAB — TROPONIN I (HIGH SENSITIVITY)
Troponin I (High Sensitivity): 55 ng/L — ABNORMAL HIGH (ref <18)
Troponin I (High Sensitivity): 55 ng/L — ABNORMAL HIGH (ref <18)

## 2021-12-05 LAB — CBC
HCT: 38.2 % (ref 36.0–46.0)
Hemoglobin: 12.2 g/dL (ref 12.0–15.0)
MCH: 26 pg (ref 26.0–34.0)
MCHC: 31.9 g/dL (ref 30.0–36.0)
MCV: 81.3 fL (ref 80.0–100.0)
Platelets: 161 10*3/uL (ref 150–400)
RBC: 4.7 MIL/uL (ref 3.87–5.11)
RDW: 14.6 % (ref 11.5–15.5)
WBC: 7.7 10*3/uL (ref 4.0–10.5)
nRBC: 0 % (ref 0.0–0.2)

## 2021-12-05 LAB — BASIC METABOLIC PANEL
Anion gap: 9 (ref 5–15)
BUN: 20 mg/dL (ref 6–20)
CO2: 28 mmol/L (ref 22–32)
Calcium: 9.5 mg/dL (ref 8.9–10.3)
Chloride: 100 mmol/L (ref 98–111)
Creatinine, Ser: 1.08 mg/dL — ABNORMAL HIGH (ref 0.44–1.00)
GFR, Estimated: 60 mL/min — ABNORMAL LOW (ref >60)
Glucose, Bld: 88 mg/dL (ref 70–99)
Potassium: 3.5 mmol/L (ref 3.5–5.1)
Sodium: 137 mmol/L (ref 135–145)

## 2021-12-05 LAB — URINALYSIS, MICROSCOPIC (REFLEX)
Bacteria, UA: NONE SEEN
Squamous Epithelial / LPF: NONE SEEN (ref 0–5)

## 2021-12-05 NOTE — ED Triage Notes (Signed)
Pt to ED POV for feeling weak and tired since 2 weeks, saw PCP today who told her she had hematuria on UA and possibly reduced renal function and told her to come to hospital "to be admitted". Pt denies urinary symptoms. Pt ambulatory, steady gait.   BP was 786 systolic at PCP today. BP here is currently158/107. Marland Kitchen Denies HA, vision changes. States used to take BP meds and stopped on her own. States PCP told her BP was high today but did not say anything about getting back on BP meds.

## 2021-12-06 ENCOUNTER — Emergency Department
Admission: EM | Admit: 2021-12-06 | Discharge: 2021-12-06 | Disposition: A | Discharge status: Home / Self Care | Payer: Medicaid Other | Attending: Emergency Medicine | Admitting: Emergency Medicine

## 2021-12-06 DIAGNOSIS — I509 Heart failure, unspecified: Secondary | ICD-10-CM

## 2021-12-06 HISTORY — DX: Essential (primary) hypertension: I10

## 2021-12-06 LAB — BRAIN NATRIURETIC PEPTIDE: B Natriuretic Peptide: 1396.5 pg/mL — ABNORMAL HIGH (ref 0.0–100.0)

## 2021-12-06 MED ORDER — FUROSEMIDE 40 MG PO TABS: 40.0000 mg | ORAL_TABLET | Freq: Every day | ORAL | 2 refills | Status: DC

## 2021-12-06 MED ORDER — FUROSEMIDE 40 MG PO TABS
40.0000 mg | ORAL_TABLET | Freq: Once | ORAL | Status: AC
  Administered 2021-12-06: 40 mg via ORAL
  Filled 2021-12-06: qty 1

## 2021-12-06 NOTE — ED Provider Notes (Signed)
[facility] Provider Note    Event Date/Time   First MD Initiated Contact with Patient 12/06/21 [phone]     (approximate)   History   Fatigue and Hypertension   HPI  Brenda Hodge is a 61 y.o. female who presents to the ED for evaluation of Fatigue and Hypertension   Patient presents to the ED for activation of subacutely worsening generalized weakness, orthopnea and dyspnea on exertion.  Denies any chest pain or pressure, syncopal episodes, fever or productive cough.  No abdominal pain or emesis.  Takes no regular prescription medications.  Does have a previous history of HTN though.   Physical Exam   Triage Vital Signs: ED Triage Vitals  Enc Vitals Group     BP 12/05/21 1620 (!) 165/131     Pulse Rate 12/05/21 1620 99     Resp 12/05/21 1620 18     Temp 12/05/21 1620 97.9 F (36.6 C)     Temp Source 12/05/21 1620 Oral     SpO2 12/05/21 1620 99 %     Weight 12/05/21 1619 172 lb (78 kg)     Height 12/05/21 1619 5\' 10"  (1.778 m)     Head Circumference --      Peak Flow --      Pain Score 12/05/21 1619 0     Pain Loc --      Pain Edu? --      Excl. in [place]? --     Most recent vital signs: Vitals:   12/06/21 0100 12/06/21 0139  BP: (!) 156/108 (!) 151/103  Pulse: 93 83  Resp:    Temp:  98.8 F (37.1 C)  SpO2: 96% 98%    General: Awake, no distress.  CV:  Good peripheral perfusion.  Resp:  Normal effort.  Abd:  No distention.  MSK:  No deformity noted.  Faint trace bilateral lower extremity edema and sock lines Neuro:  No focal deficits appreciated. Other:     ED Results / Procedures / Treatments   Labs (all labs ordered are listed, but only abnormal results are displayed) Labs Reviewed  BASIC METABOLIC PANEL - Abnormal; Notable for the following components:      Result Value   Creatinine, Ser 1.08 (*)    GFR, Estimated 60 (*)    All other components within normal limits  URINALYSIS, ROUTINE W REFLEX MICROSCOPIC - Abnormal;  Notable for the following components:   Specific Gravity, Urine <1.005 (*)    Hgb urine dipstick LARGE (*)    Protein, ur 100 (*)    All other components within normal limits  BRAIN NATRIURETIC PEPTIDE - Abnormal; Notable for the following components:   B Natriuretic Peptide 1,396.5 (*)    All other components within normal limits  TROPONIN I (HIGH SENSITIVITY) - Abnormal; Notable for the following components:   Troponin I (High Sensitivity) 55 (*)    All other components within normal limits  TROPONIN I (HIGH SENSITIVITY) - Abnormal; Notable for the following components:   Troponin I (High Sensitivity) 55 (*)    All other components within normal limits  CBC  URINALYSIS, MICROSCOPIC (REFLEX)  POC URINE PREG, ED    EKG Fairly poor quality EKG seems to demonstrate a sinus rhythm with multifocal PVCs, the sinus beats seem to have normal axis and intervals between them.  No clear ischemic features.  No recent comparison.  RADIOLOGY 2 view CXR interpreted by me with interstitial edema throughout without pleural effusion or  particular infiltrate  Official radiology report(s): DG Chest 2 View  Result Date: 12/05/2021 CLINICAL DATA:  Weakness.  Tired. EXAM: CHEST - 2 VIEW COMPARISON:  Radiograph 07/19/2014 FINDINGS: Mild cardiomegaly. Diffuse interstitial and alveolar opacities are typical of pulmonary edema. No significant pleural effusion. Streaky left perihilar opacities, favoring atelectasis. No pneumothorax. No acute osseous abnormalities are seen. IMPRESSION: Mild cardiomegaly with diffuse interstitial and alveolar opacities typical of pulmonary edema. Electronically Signed   By: Narda Rutherford M.D.   On: 12/05/2021 16:58    PROCEDURES and INTERVENTIONS:  .1-3 Lead EKG Interpretation  Performed by: Delton Prairie, MD Authorized by: Delton Prairie, MD     Interpretation: normal     ECG rate:  77   ECG rate assessment: normal     Rhythm: sinus rhythm     Ectopy: none      Conduction: normal     Medications  furosemide (LASIX) tablet 40 mg (40 mg Oral Given 12/06/21 0104)     IMPRESSION / MDM / ASSESSMENT AND PLAN / ED COURSE  I reviewed the triage vital signs and the nursing notes.  Differential diagnosis includes, but is not limited to, CHF, pneumonia, pneumothorax, ACS  {Patient presents with symptoms of an acute illness or injury that is potentially life-threatening.  61 year old female presents to the ED with subacutely worsening dyspnea and fatigue, likely new onset CHF suitable for initiation of diuretics and CHF clinic follow-up.  No hypoxia.  No gross volume overload or indications for BiPAP.  CXR is congested without infiltrates.  Normal CBC without signs of leukocytosis or sepsis.  No significant metabolic derangements.  Her BNP is quite elevated.  Troponins are marginally elevated and flat.  No chest pain and I doubt ACS.  I considered admission for this patient, but she responds well to diuretics and outpatient management with CHF clinic follow-up.      FINAL CLINICAL IMPRESSION(S) / ED DIAGNOSES   Final diagnoses:  New onset of congestive heart failure (HCC)     Rx / DC Orders   ED Discharge Orders          Ordered    furosemide (LASIX) 40 MG tablet  Daily        12/06/21 0059    AMB referral to CHF clinic        12/06/21 0111             Note:  This document was prepared using Dragon voice recognition software and may include unintentional dictation errors.   Delton Prairie, MD 12/06/21 [phone]

## 2021-12-06 NOTE — ED Notes (Signed)
DC  instructions given verbally and in writing.. understanding voiced.  Signature obtained. Pt left in stable condition with family to POV.

## 2021-12-06 NOTE — Discharge Instructions (Addendum)
Please start taking the furosemide/Lasix medication once per day every day.  Often times better in the morning because it will make you pee all day.  They should give you a call about being seen in the heart failure clinic.   If you develop any further worsening symptoms, chest pain or persistent trouble breathing then please return to the ED.

## 2021-12-13 ENCOUNTER — Ambulatory Visit: Payer: Medicaid Other | Admitting: Family

## 2021-12-28 ENCOUNTER — Encounter: Payer: Self-pay | Admitting: Intensive Care

## 2021-12-28 ENCOUNTER — Emergency Department: Payer: Medicaid Other

## 2021-12-28 ENCOUNTER — Emergency Department
Admission: EM | Admit: 2021-12-28 | Discharge: 2021-12-29 | DRG: 189 | Disposition: A | Discharge status: Home / Self Care | Payer: Medicaid Other | Attending: Internal Medicine | Admitting: Internal Medicine

## 2021-12-28 ENCOUNTER — Other Ambulatory Visit: Payer: Self-pay

## 2021-12-28 DIAGNOSIS — I509 Heart failure, unspecified: Secondary | ICD-10-CM

## 2021-12-28 DIAGNOSIS — J810 Acute pulmonary edema: Secondary | ICD-10-CM | POA: Diagnosis not present

## 2021-12-28 DIAGNOSIS — Z1152 Encounter for screening for COVID-19: Secondary | ICD-10-CM | POA: Diagnosis not present

## 2021-12-28 LAB — CBC WITH DIFFERENTIAL/PLATELET
Abs Immature Granulocytes: 0.02 10*3/uL (ref 0.00–0.07)
Basophils Absolute: 0.1 10*3/uL (ref 0.0–0.1)
Basophils Relative: 1 %
Eosinophils Absolute: 0.6 10*3/uL — ABNORMAL HIGH (ref 0.0–0.5)
Eosinophils Relative: 7 %
HCT: 38 % (ref 36.0–46.0)
Hemoglobin: 12.2 g/dL (ref 12.0–15.0)
Immature Granulocytes: 0 %
Lymphocytes Relative: 22 %
Lymphs Abs: 2 10*3/uL (ref 0.7–4.0)
MCH: 25.8 pg — ABNORMAL LOW (ref 26.0–34.0)
MCHC: 32.1 g/dL (ref 30.0–36.0)
MCV: 80.3 fL (ref 80.0–100.0)
Monocytes Absolute: 0.8 10*3/uL (ref 0.1–1.0)
Monocytes Relative: 9 %
Neutro Abs: 5.5 10*3/uL (ref 1.7–7.7)
Neutrophils Relative %: 61 %
Platelets: 161 10*3/uL (ref 150–400)
RBC: 4.73 MIL/uL (ref 3.87–5.11)
RDW: 14.8 % (ref 11.5–15.5)
Smear Review: NORMAL
WBC: 9 10*3/uL (ref 4.0–10.5)
nRBC: 0 % (ref 0.0–0.2)

## 2021-12-28 LAB — TROPONIN I (HIGH SENSITIVITY)
Troponin I (High Sensitivity): 65 ng/L — ABNORMAL HIGH (ref <18)
Troponin I (High Sensitivity): 71 ng/L — ABNORMAL HIGH (ref <18)

## 2021-12-28 LAB — COMPREHENSIVE METABOLIC PANEL
ALT: 28 U/L (ref 0–44)
AST: 28 U/L (ref 15–41)
Albumin: 3.4 g/dL — ABNORMAL LOW (ref 3.5–5.0)
Alkaline Phosphatase: 84 U/L (ref 38–126)
Anion gap: 11 (ref 5–15)
BUN: 15 mg/dL (ref 6–20)
CO2: 30 mmol/L (ref 22–32)
Calcium: 9.4 mg/dL (ref 8.9–10.3)
Chloride: 96 mmol/L — ABNORMAL LOW (ref 98–111)
Creatinine, Ser: 1.07 mg/dL — ABNORMAL HIGH (ref 0.44–1.00)
GFR, Estimated: >60 mL/min (ref >60)
Glucose, Bld: 94 mg/dL (ref 70–99)
Potassium: 3 mmol/L — ABNORMAL LOW (ref 3.5–5.1)
Sodium: 137 mmol/L (ref 135–145)
Total Bilirubin: 0.6 mg/dL (ref 0.3–1.2)
Total Protein: 6.9 g/dL (ref 6.5–8.1)

## 2021-12-28 LAB — RESP PANEL BY RT-PCR (FLU A&B, COVID) ARPGX2
Influenza A by PCR: NEGATIVE
Influenza B by PCR: NEGATIVE
SARS Coronavirus 2 by RT PCR: NEGATIVE

## 2021-12-28 LAB — BRAIN NATRIURETIC PEPTIDE: B Natriuretic Peptide: 2163.3 pg/mL — ABNORMAL HIGH (ref 0.0–100.0)

## 2021-12-28 MED ORDER — POTASSIUM CHLORIDE 10 MEQ/100ML IV SOLN
10.0000 meq | Freq: Once | INTRAVENOUS | Status: AC
  Administered 2021-12-28: 10 meq via INTRAVENOUS
  Filled 2021-12-28: qty 100

## 2021-12-28 MED ORDER — POTASSIUM CHLORIDE 20 MEQ PO PACK
40.0000 meq | PACK | Freq: Once | ORAL | Status: AC
  Administered 2021-12-28: 40 meq via ORAL
  Filled 2021-12-28: qty 2

## 2021-12-28 MED ORDER — FUROSEMIDE 10 MG/ML IJ SOLN
40.0000 mg | Freq: Once | INTRAMUSCULAR | Status: AC
  Administered 2021-12-28: 40 mg via INTRAVENOUS
  Filled 2021-12-28: qty 4

## 2021-12-28 NOTE — ED Provider Notes (Signed)
[facility] Provider Note    Event Date/Time   First MD Initiated Contact with Patient 12/28/21 2217     (approximate)   History   Weakness and Shortness of Breath   HPI  Brenda Hodge is a 61 y.o. female   Past medical history of recently dx'd CHF here with dyspnea on exertion and orthopnea over 2 days since running out of her lasix. No chest pain, no cough or fever.   Referred to CHF clinic during last ED visit for fluid overload and has f/u scheduled for Tuesday per patient.   History was obtained via patient and review of external med notes including ED visit 2 weeks ago      Physical Exam   Triage Vital Signs: ED Triage Vitals  Enc Vitals Group     BP 12/28/21 1724 (!) 150/111     Pulse Rate 12/28/21 1724 93     Resp 12/28/21 1724 20     Temp 12/28/21 1724 97.6 F (36.4 C)     Temp Source 12/28/21 1724 Oral     SpO2 12/28/21 1724 94 %     Weight 12/28/21 1809 170 lb (77.1 kg)     Height 12/28/21 1809 5\' 9"  (1.753 m)     Head Circumference --      Peak Flow --      Pain Score 12/28/21 1809 0     Pain Loc --      Pain Edu? --      Excl. in [place]? --     Most recent vital signs: Vitals:   12/28/21 1808 12/28/21 2251  BP: (!) 166/108 (!) 186/120  Pulse: 95 95  Resp: 18 (!) 22  Temp: 98.2 F (36.8 C)   SpO2: 97% 92%    General: Awake, no distress.  CV:  Good peripheral perfusion.  Resp:  Normal effort. Rales bases bilaterally Abd:  No distention.  Other:  No significant peripheral edema   ED Results / Procedures / Treatments   Labs (all labs ordered are listed, but only abnormal results are displayed) Labs Reviewed  COMPREHENSIVE METABOLIC PANEL - Abnormal; Notable for the following components:      Result Value   Potassium 3.0 (*)    Chloride 96 (*)    Creatinine, Ser 1.07 (*)    Albumin 3.4 (*)    All other components within normal limits  CBC WITH DIFFERENTIAL/PLATELET - Abnormal; Notable for the following  components:   MCH 25.8 (*)    Eosinophils Absolute 0.6 (*)    All other components within normal limits  BRAIN NATRIURETIC PEPTIDE - Abnormal; Notable for the following components:   B Natriuretic Peptide 2,163.3 (*)    All other components within normal limits  TROPONIN I (HIGH SENSITIVITY) - Abnormal; Notable for the following components:   Troponin I (High Sensitivity) 65 (*)    All other components within normal limits  RESP PANEL BY RT-PCR (FLU A&B, COVID) [id number]  TROPONIN I (HIGH SENSITIVITY)     I reviewed labs and they are notable for elevated BNP  EKG  ED ECG REPORT I, Lucillie Garfinkel, the attending physician, personally viewed and interpreted this ECG.   Date: 12/28/2021  EKG Time: 1717  Rate: 91  Rhythm: normal EKG, normal sinus rhythm, frequent PVC's noted  Axis: nl  Intervals:no ischemic changes, frequent pvc     RADIOLOGY I independently reviewed and interpreted CXR w pulm edema   PROCEDURES:  Critical Care performed:  No  Procedures   MEDICATIONS ORDERED IN ED: Medications  potassium chloride 10 mEq in 100 mL IVPB (10 mEq Intravenous New Bag/Given 12/28/21 2252)  furosemide (LASIX) injection 40 mg (has no administration in time range)  furosemide (LASIX) injection 40 mg (40 mg Intravenous Given 12/28/21 2253)  potassium chloride (KLOR-CON) packet 40 mEq (40 mEq Oral Given 12/28/21 2253)     IMPRESSION / MDM / ASSESSMENT AND PLAN / ED COURSE  I reviewed the triage vital signs and the nursing notes.                              Differential diagnosis includes, but is not limited to, CHF, ACS, infection, less likely pe  MDM:  Pt w evidence of fluid overload ran out of lasix but has f/u next week. No resp distress and no CP to indicate cardiac ischemia, nl ECG. Diuresis via IV in ED and reassess for stability for dc vs admission.   Patient's presentation is most consistent with acute presentation with potential threat to life or bodily function.        FINAL CLINICAL IMPRESSION(S) / ED DIAGNOSES   Final diagnoses:  Acute pulmonary edema ([place])     Rx / DC Orders   ED Discharge Orders     None        Note:  This document was prepared using Dragon voice recognition software and may include unintentional dictation errors.    Lucillie Garfinkel, MD 12/28/21 2322

## 2021-12-28 NOTE — ED Triage Notes (Addendum)
Patient c/o fatigue, sob, and weakness X1 week that worsened today. Patient was seen in September here for same symptoms and discharged from ER with water pills. Patient was told to follow up with PCP and has not.

## 2021-12-28 NOTE — ED Provider Triage Note (Signed)
  Emergency Medicine Provider Triage Evaluation Note  MEMPHIS CRESWELL , a 61 y.o.female,  was evaluated in triage.  Pt complains of shortness of breath and weakness for the past few days, particularly worse today.  She states that she has a history of heart failure.  Endorses cough and congestion recently as well.  Denies any other recent injuries.    Review of Systems  Positive: Shortness of breath, weakness Negative: Denies fever, chest pain, vomiting  Physical Exam  There were no vitals filed for this visit. Gen:   Awake, no distress   Resp:  Normal effort  MSK:   Moves extremities without difficulty  Other:    Medical Decision Making  Given the patient's initial medical screening exam, the following diagnostic evaluation has been ordered. The patient will be placed in the appropriate treatment space, once one is available, to complete the evaluation and treatment. I have discussed the plan of care with the patient and I have advised the patient that an ED physician or mid-level practitioner will reevaluate their condition after the test results have been received, as the results may give them additional insight into the type of treatment they may need.    Diagnostics: Labs, EKG, CXR, respiratory panel  Treatments: none immediately   Teodoro Spray, [place] 12/28/21 1700

## 2021-12-29 MED ORDER — POTASSIUM CHLORIDE CRYS ER 20 MEQ PO TBCR
40.0000 meq | EXTENDED_RELEASE_TABLET | Freq: Once | ORAL | Status: AC
  Administered 2021-12-29: 40 meq via ORAL
  Filled 2021-12-29: qty 2

## 2021-12-29 MED ORDER — FUROSEMIDE 40 MG PO TABS: 40.0000 mg | ORAL_TABLET | Freq: Every day | ORAL | 1 refills | Status: DC

## 2021-12-29 NOTE — ED Provider Notes (Signed)
Patient received in signout from Dr. Jacelyn Grip pending reevaluation after repeat dose of IV Lasix.  She is put on quite a bit a urine and feeling better after the second dose.  Sats are okay as was the rest of her vitals.  She is comfortable going home and requesting discharge.  I provided her another prescription for Lasix and refer her back to the CHF clinic and stressed the importance of following up with them.  Provided potassium repletion prior to discharge.   Vladimir Crofts, MD 12/29/21 [phone]

## 2021-12-29 NOTE — ED Notes (Signed)
Pt's care discussed with EDP. EDP aware of patient's BP, states okay for discharge. Discussed with patient need to follow up with PCP regarding BP meds and follow up with heart failure clinic. Pt states understanding at this time. Pt discharged into the care of a family member, denies further needs at this time.

## 2022-01-08 ENCOUNTER — Ambulatory Visit (HOSPITAL_BASED_OUTPATIENT_CLINIC_OR_DEPARTMENT_OTHER): Payer: Medicaid Other | Admitting: Family

## 2022-01-08 ENCOUNTER — Other Ambulatory Visit
Admission: RE | Admit: 2022-01-08 | Discharge: 2022-01-08 | Disposition: A | Discharge status: Home / Self Care | Payer: Medicaid Other | Source: Ambulatory Visit | Attending: Family | Admitting: Family

## 2022-01-08 ENCOUNTER — Telehealth: Payer: Self-pay | Admitting: Family

## 2022-01-08 ENCOUNTER — Encounter: Payer: Self-pay | Admitting: Family

## 2022-01-08 VITALS — BP 181/114 | HR 55 | Resp 16 | Ht 69.0 in | Wt 168.4 lb

## 2022-01-08 DIAGNOSIS — F17210 Nicotine dependence, cigarettes, uncomplicated: Secondary | ICD-10-CM | POA: Insufficient documentation

## 2022-01-08 DIAGNOSIS — G479 Sleep disorder, unspecified: Secondary | ICD-10-CM | POA: Insufficient documentation

## 2022-01-08 DIAGNOSIS — I10 Essential (primary) hypertension: Secondary | ICD-10-CM | POA: Diagnosis not present

## 2022-01-08 DIAGNOSIS — I110 Hypertensive heart disease with heart failure: Secondary | ICD-10-CM | POA: Insufficient documentation

## 2022-01-08 DIAGNOSIS — D649 Anemia, unspecified: Secondary | ICD-10-CM

## 2022-01-08 DIAGNOSIS — Z720 Tobacco use: Secondary | ICD-10-CM | POA: Diagnosis not present

## 2022-01-08 DIAGNOSIS — I5032 Chronic diastolic (congestive) heart failure: Secondary | ICD-10-CM | POA: Insufficient documentation

## 2022-01-08 DIAGNOSIS — Z79899 Other long term (current) drug therapy: Secondary | ICD-10-CM | POA: Insufficient documentation

## 2022-01-08 DIAGNOSIS — R5383 Other fatigue: Secondary | ICD-10-CM | POA: Insufficient documentation

## 2022-01-08 LAB — BASIC METABOLIC PANEL
Anion gap: 8 (ref 5–15)
BUN: 17 mg/dL (ref 6–20)
CO2: 30 mmol/L (ref 22–32)
Calcium: 9.2 mg/dL (ref 8.9–10.3)
Chloride: 101 mmol/L (ref 98–111)
Creatinine, Ser: 1.13 mg/dL — ABNORMAL HIGH (ref 0.44–1.00)
GFR, Estimated: 56 mL/min — ABNORMAL LOW (ref >60)
Glucose, Bld: 77 mg/dL (ref 70–99)
Potassium: 2.6 mmol/L — CL (ref 3.5–5.1)
Sodium: 139 mmol/L (ref 135–145)

## 2022-01-08 LAB — BRAIN NATRIURETIC PEPTIDE: B Natriuretic Peptide: 1771.1 pg/mL — ABNORMAL HIGH (ref 0.0–100.0)

## 2022-01-08 MED ORDER — LOSARTAN POTASSIUM 50 MG PO TABS: 50.0000 mg | ORAL_TABLET | Freq: Every day | ORAL | 3 refills | Status: DC

## 2022-01-08 MED ORDER — POTASSIUM CHLORIDE CRYS ER 20 MEQ PO TBCR: EXTENDED_RELEASE_TABLET | ORAL | 3 refills | Status: DC

## 2022-01-08 NOTE — Telephone Encounter (Signed)
Spoke with patient regarding BMP results obtained earlier today. BNP is improving and renal function looks good. Potassium is critical at 2.6. Has not been taking any potassium supplements while taking furosemide 40mg  daily.   Advised patient to start taking potassium as 4 tablets daily (in 2 divided doses) X 2 days and then take 2 tablets daily until we recheck labs again. Patient able to repeat these instructions back to me and verbalized understanding.

## 2022-01-08 NOTE — Patient Instructions (Addendum)
Begin weighing daily and call for an overnight weight gain of 3 pounds or more or a weekly weight gain of more than 5 pounds.   If you have voicemail, please make sure your mailbox is cleaned out so that we may leave a message and please make sure to listen to any voicemails.    Try to keep daily sodium intake to 2000mg  daily. Try to not add any salt to your food and try to use Mrs Deliah Boston for your seasoning.    Start taking losartan as 1 tablet every day for your blood pressure.
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
## 2022-01-22 ENCOUNTER — Ambulatory Visit: Payer: Medicaid Other | Admitting: Family
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
## 2022-02-13 ENCOUNTER — Encounter: Payer: Self-pay | Admitting: Pharmacist

## 2022-02-13 ENCOUNTER — Ambulatory Visit (HOSPITAL_BASED_OUTPATIENT_CLINIC_OR_DEPARTMENT_OTHER): Payer: Medicaid Other | Admitting: Family

## 2022-02-13 ENCOUNTER — Other Ambulatory Visit
Admission: RE | Admit: 2022-02-13 | Discharge: 2022-02-13 | Disposition: A | Discharge status: Home / Self Care | Payer: Medicaid Other | Source: Ambulatory Visit | Attending: Family | Admitting: Family

## 2022-02-13 ENCOUNTER — Encounter: Payer: Self-pay | Admitting: Family

## 2022-02-13 ENCOUNTER — Ambulatory Visit: Admission: RE | Admit: 2022-02-13 | Payer: Medicaid Other | Source: Ambulatory Visit

## 2022-02-13 VITALS — BP 156/98 | HR 78 | Resp 18 | Wt 162.1 lb

## 2022-02-13 DIAGNOSIS — I5032 Chronic diastolic (congestive) heart failure: Secondary | ICD-10-CM | POA: Insufficient documentation

## 2022-02-13 DIAGNOSIS — D649 Anemia, unspecified: Secondary | ICD-10-CM | POA: Diagnosis not present

## 2022-02-13 DIAGNOSIS — Z720 Tobacco use: Secondary | ICD-10-CM

## 2022-02-13 DIAGNOSIS — I10 Essential (primary) hypertension: Secondary | ICD-10-CM

## 2022-02-13 LAB — BASIC METABOLIC PANEL
Anion gap: 7 (ref 5–15)
BUN: 21 mg/dL — ABNORMAL HIGH (ref 6–20)
CO2: 31 mmol/L (ref 22–32)
Calcium: 9.4 mg/dL (ref 8.9–10.3)
Chloride: 108 mmol/L (ref 98–111)
Creatinine, Ser: 1.09 mg/dL — ABNORMAL HIGH (ref 0.44–1.00)
GFR, Estimated: 59 mL/min — ABNORMAL LOW (ref >60)
Glucose, Bld: 83 mg/dL (ref 70–99)
Potassium: 4.3 mmol/L (ref 3.5–5.1)
Sodium: 146 mmol/L — ABNORMAL HIGH (ref 135–145)

## 2022-02-13 LAB — BRAIN NATRIURETIC PEPTIDE: B Natriuretic Peptide: 1294.2 pg/mL — ABNORMAL HIGH (ref 0.0–100.0)

## 2022-02-13 MED ORDER — VALSARTAN 160 MG PO TABS: 160.0000 mg | ORAL_TABLET | Freq: Every day | ORAL | 3 refills | Status: DC

## 2022-02-13 MED ORDER — FUROSEMIDE 40 MG PO TABS: 40.0000 mg | ORAL_TABLET | Freq: Every day | ORAL | 3 refills | Status: DC

## 2022-02-13 NOTE — Patient Instructions (Addendum)
Continue weighing daily and call for an overnight weight gain of 3 pounds or more or a weekly weight gain of more than 5 pounds.   If you have voicemail, please make sure your mailbox is cleaned out so that we may leave a message and please make sure to listen to any voicemails.    Stop taking losartan and you will start taking valsartan 160mg  as 1 tablet every day.     Call after you look at your schedule so that we can reschedule your echocardiogram (heart ultrasound)
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
## 2022-02-14 ENCOUNTER — Other Ambulatory Visit (HOSPITAL_COMMUNITY): Payer: Self-pay

## 2022-02-14 NOTE — Progress Notes (Addendum)
Patient ID: HA SHANNAHAN, female   DOB: [birth year], 61 y.o.   MRN: [id number]  [facility] - HEART FAILURE CLINIC - PHARMACIST COUNSELING NOTE  *HFpEF*  Guideline-Directed Medical Therapy/Evidence Based Medicine  ACE/ARB/ARNI: Losartan 50 mg daily Beta Blocker:  none Aldosterone Antagonist:  none Diuretic: Furosemide 40 mg daily  SGLT2i:  none  Adherence Assessment  Do you ever forget to take your medication? [] Yes [x] No  Do you ever skip doses due to side effects? [] Yes [x] No  Do you have trouble affording your medicines? [] Yes [x] No  Are you ever unable to pick up your medication due to transportation difficulties? [] Yes [x] No  Do you ever stop taking your medications because you don't believe they are helping? [] Yes [x] No  Do you check your weight daily? [x] Yes [] No   Adherence strategy: pill box  Barriers to obtaining medications: none  Vital signs: HR 78, BP 177/116, weight (pounds) 162 lbs  ECHO: Date 07/20/14, EF 55-60%, notes mld LVH     Latest Ref Rng & Units 02/13/2022    1:33 PM 01/08/2022    3:00 PM 12/28/2021    6:13 PM  BMP  Glucose 70 - 99 mg/dL 83  77  94   BUN 6 - 20 mg/dL 21  17  15    Creatinine 0.44 - 1.00 mg/dL     Sodium 135 - 145 mmol/L 146  139  137   Potassium 3.5 - 5.1 mmol/L 4.3  2.6  3.0   Chloride 98 - 111 mmol/L 108  101  96   CO2 22 - 32 mmol/L 31  30  30    Calcium 8.9 - 10.3 mg/dL 9.4  9.2  9.4     Past Medical History:  Diagnosis Date   Anemia    CHF (congestive heart failure) (HCC)    Hypertension     ASSESSMENT 61 year old female who presents to the HF clinic for follow up. PMH includes anemia, HTN, previous tobacco use and chronic heart failure. Last ED admission was 12/28/21 due ti weakness and SOB.   Medication reconciliation completed during OV. Noted patient taking BC powder PRN for pain. Reports not regular consumption. She was scheduled to repeat ECHO this AM, but didn't show to  study. Noted last BNP was 1771 and K = 2.6 on 01/08/2022. Patient getting K replacement daily but no BMP done in 4 weeks. BP remains above goal initially and at repeat.  PLAN  Repeat BMP and BNP Change losartan 50mg  daily to valsartan 160mg  daily Repeat ECHO as soon as possible Consider adding SGLT2i during follow up visit.  Time spent: 15 minutes Ram Haugan Rodriguez-Guzman PharmD, BCPS 02/14/2022 12:51 PM   Current Outpatient Medications:    Aspirin-Salicylamide-Caffeine (BC HEADACHE PO), Take by mouth daily as needed., Disp: , Rfl:    furosemide (LASIX) 40 MG tablet, Take 1 tablet (40 mg total) by mouth daily. And extra 40mg  if needed for shortness of breath or weight gain, Disp: 120 tablet, Rfl: 3   potassium chloride SA (KLOR-CON M) 20 MEQ tablet, Take 4 tablets daily for 2 days and then 2 tablets every day after that, Disp: 70 tablet, Rfl: 3   valsartan (DIOVAN) 160 MG tablet, Take 1 tablet (160 mg total) by mouth daily., Disp: 90 tablet, Rfl: 3   MEDICATION ADHERENCES TIPS AND STRATEGIES Taking medication as prescribed improves patient outcomes in heart failure (reduces hospitalizations, improves symptoms, increases survival) Side effects of medications can be managed by decreasing  doses, switching agents, stopping drugs, or adding additional therapy. Please let someone in the Heart Failure Clinic know if you have having bothersome side effects so we can modify your regimen. Do not alter your medication regimen without talking to [place].  Medication reminders can help patients remember to take drugs on time. If you are missing or forgetting doses you can try linking behaviors, using pill boxes, or an electronic reminder like an alarm on your phone or an app. Some people can also get automated phone calls as medication reminders.
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
## 2022-03-13 ENCOUNTER — Encounter: Payer: Medicaid Other | Admitting: Family

## 2022-03-13 ENCOUNTER — Telehealth: Payer: Self-pay | Admitting: Family

## 2022-03-13 NOTE — Telephone Encounter (Signed)
Patient did not show for her [facility] appointment on 03/13/22. Will attempt to reschedule.   

## 2022-05-29 ENCOUNTER — Other Ambulatory Visit: Payer: Self-pay | Admitting: Family

## 2022-07-13 ENCOUNTER — Emergency Department (HOSPITAL_COMMUNITY)
Admission: EM | Admit: 2022-07-13 | Discharge: 2022-07-17 | Disposition: E | Payer: Medicaid Other | Attending: Emergency Medicine | Admitting: Emergency Medicine

## 2022-07-13 ENCOUNTER — Encounter (HOSPITAL_COMMUNITY): Payer: Self-pay | Admitting: Emergency Medicine

## 2022-07-13 DIAGNOSIS — I469 Cardiac arrest, cause unspecified: Secondary | ICD-10-CM

## 2022-07-13 DIAGNOSIS — I110 Hypertensive heart disease with heart failure: Secondary | ICD-10-CM | POA: Insufficient documentation

## 2022-07-13 DIAGNOSIS — I509 Heart failure, unspecified: Secondary | ICD-10-CM | POA: Diagnosis not present

## 2022-07-13 DIAGNOSIS — Z87891 Personal history of nicotine dependence: Secondary | ICD-10-CM | POA: Diagnosis not present

## 2022-07-13 MED ORDER — EPINEPHRINE PF 1 MG/ML IJ SOLN
0.3000 mg | Freq: Once | INTRAMUSCULAR | Status: AC
  Administered 2022-07-13: 0.3 mg via INTRAVENOUS

## 2022-07-13 MED ORDER — SODIUM BICARBONATE 8.4 % IV SOLN
50.0000 meq | Freq: Once | INTRAVENOUS | Status: AC
  Administered 2022-07-13: 50 meq via INTRAVENOUS

## 2022-07-17 NOTE — ED Notes (Signed)
ED Provider at bedside. 
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
## 2022-07-17 NOTE — ED Notes (Signed)
Whole amp of bicarb given

## 2022-07-17 NOTE — ED Notes (Signed)
Whole amp of epi given
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
## 2022-07-17 NOTE — ED Notes (Signed)
Pulse check by EDP via [place]

## 2022-07-17 NOTE — ED Notes (Signed)
EDP called time of death at [phone]

## 2022-07-17 NOTE — ED Notes (Signed)
Pulse check  by EDP via [place].   Resume cpr

## 2022-07-17 NOTE — ED Notes (Signed)
7.5 ett tube placed by EDP, positive color change with BVM ventilations by RT

## 2022-07-17 NOTE — ED Notes (Signed)
Pulse check by EDP  Resume CPR

## 2022-07-17 NOTE — ED Notes (Addendum)
Pt's son at bedside   EDP notified pt's spouse and sister via phone

## 2022-07-17 NOTE — ED Notes (Signed)
Pt taken to morgue by security.

## 2022-07-17 NOTE — ED Notes (Signed)
Multiple family members at bedside

## 2022-07-17 DEATH — deceased
